# Patient Record
Sex: Female | Born: 2005 | Race: White | Hispanic: No | Marital: Single | Smoking: Never smoker
Health system: Southern US, Community
[De-identification: ages and names within clinical notes are randomized; demographics above are authoritative.]

## PROBLEM LIST (undated history)

## (undated) DIAGNOSIS — K0889 Other specified disorders of teeth and supporting structures: Secondary | ICD-10-CM

## (undated) DIAGNOSIS — M419 Scoliosis, unspecified: Secondary | ICD-10-CM

## (undated) DIAGNOSIS — F419 Anxiety disorder, unspecified: Secondary | ICD-10-CM

## (undated) DIAGNOSIS — F32A Depression, unspecified: Secondary | ICD-10-CM

## (undated) DIAGNOSIS — K429 Umbilical hernia without obstruction or gangrene: Secondary | ICD-10-CM

## (undated) HISTORY — DX: Anxiety disorder, unspecified: F41.9

## (undated) HISTORY — DX: Scoliosis, unspecified: M41.9

## (undated) HISTORY — DX: Depression, unspecified: F32.A

---
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
## 2024-01-13 ENCOUNTER — Telehealth: Payer: Self-pay

## 2024-01-13 NOTE — Telephone Encounter (Signed)
 TRIAGE VOICEMAIL: Patient states she takes a birth control for hormonal reasons along with her nexplanon. She needs a refill on that. She's not sure if she needs to request from pharmacy or if she needs a refill sent bc she doesn't take it all the time.

## 2024-01-13 NOTE — Telephone Encounter (Signed)
 Spoke with patient. Advised a full years rx was sent  03/09/23. She should contact pharmacy to request refill. They will reach out to [place] if there are any issues and a refill is needed form [place].

## 2024-02-14 NOTE — Progress Notes (Unsigned)
   No chief complaint on file.    HPI:  Tracey Griffith is a 18 y.o. G0P0000 here for Nexplanon removal and insertion. Nepxlanon placed 4/22  There were no vitals taken for this visit.   Nexplanon removal Procedure note - The Nexplanon was noted in the patient's arm and the end was identified. The skin was cleansed with a Betadine solution. A small injection of subcutaneous lidocaine with epinephrine was given over the end of the implant. An incision was made at the end of the implant. The rod was noted in the incision and grasped with a hemostat. It was noted to be intact.  Steri-Strip was placed approximating the incision. Hemostasis was noted.  Nexplanon Insertion  Patient given informed consent, signed copy in the chart, time out was performed. Pregnancy test was ***. Appropriate time out taken.  Patient's LEFT/RIGHT *** arm was prepped and draped in the usual sterile fashion. The ruler used to measure and mark insertion area.  Pt was prepped with betadine swab and then injected with *** cc of 2% lidocaine with epinephrine. Nexplanon removed form packaging,  Device confirmed in needle, then inserted full length of needle and withdrawn per handbook instructions.  Pt insertion site covered with steri-strip and a bandage.   Minimal blood loss.  Pt tolerated the procedure well.  Assessment: No diagnosis found.   No orders of the defined types were placed in this encounter.    Plan:   She was told to remove the dressing in 12-24 hours, to keep the incision area dry for 24 hours and to remove the Steristrip in 2-3  days.  Notify us  if any signs of tenderness, redness, pain, or fevers develop.   Jayda White B. Kayshawn Ozburn, PA-C 02/14/2024 4:47 PM

## 2024-02-15 ENCOUNTER — Other Ambulatory Visit (HOSPITAL_COMMUNITY)
Admission: RE | Admit: 2024-02-15 | Discharge: 2024-02-15 | Disposition: A | Discharge status: Home / Self Care | Source: Ambulatory Visit | Attending: Obstetrics and Gynecology | Admitting: Obstetrics and Gynecology

## 2024-02-15 ENCOUNTER — Encounter: Payer: Self-pay | Admitting: Obstetrics and Gynecology

## 2024-02-15 ENCOUNTER — Ambulatory Visit (INDEPENDENT_AMBULATORY_CARE_PROVIDER_SITE_OTHER): Admitting: Obstetrics and Gynecology

## 2024-02-15 VITALS — BP 124/62 | Ht 67.0 in | Wt 131.0 lb

## 2024-02-15 DIAGNOSIS — Z113 Encounter for screening for infections with a predominantly sexual mode of transmission: Secondary | ICD-10-CM | POA: Insufficient documentation

## 2024-02-15 DIAGNOSIS — Z3046 Encounter for surveillance of implantable subdermal contraceptive: Secondary | ICD-10-CM

## 2024-02-15 DIAGNOSIS — E559 Vitamin D deficiency, unspecified: Secondary | ICD-10-CM

## 2024-02-15 MED ORDER — ETONOGESTREL 68 MG ~~LOC~~ IMPL
68.0000 mg | DRUG_IMPLANT | Freq: Once | SUBCUTANEOUS | Status: AC
  Administered 2024-02-15: 68 mg via SUBCUTANEOUS

## 2024-02-15 NOTE — Patient Instructions (Signed)
I value your feedback and you entrusting [place] with your care. If you get a [facility] patient survey, I would appreciate you taking the time to let us know about your experience today. Thank you!  Remove the dressing in 24 hours,  keep the incision area dry for 24 hours and remove the Steristrip in 2-3  days.  Notify us if any signs of tenderness, redness, pain, or fevers develop.

## 2024-02-17 LAB — CERVICOVAGINAL ANCILLARY ONLY
Chlamydia: NEGATIVE
Comment: NEGATIVE
Comment: NORMAL
Neisseria Gonorrhea: NEGATIVE

## 2024-03-27 DIAGNOSIS — Z20822 Contact with and (suspected) exposure to covid-19: Secondary | ICD-10-CM | POA: Diagnosis not present

## 2024-03-27 DIAGNOSIS — R070 Pain in throat: Secondary | ICD-10-CM | POA: Diagnosis not present

## 2024-03-27 DIAGNOSIS — J069 Acute upper respiratory infection, unspecified: Secondary | ICD-10-CM | POA: Diagnosis not present

## 2024-06-21 DIAGNOSIS — U071 COVID-19: Secondary | ICD-10-CM | POA: Diagnosis not present

## 2024-06-21 DIAGNOSIS — R509 Fever, unspecified: Secondary | ICD-10-CM | POA: Diagnosis not present

## 2024-06-21 DIAGNOSIS — Z20822 Contact with and (suspected) exposure to covid-19: Secondary | ICD-10-CM | POA: Diagnosis not present

## 2024-07-21 ENCOUNTER — Encounter: Payer: Self-pay | Admitting: *Deleted

## 2024-09-25 ENCOUNTER — Encounter: Payer: Self-pay | Admitting: Internal Medicine

## 2024-09-25 ENCOUNTER — Ambulatory Visit: Admitting: Internal Medicine

## 2024-09-25 ENCOUNTER — Other Ambulatory Visit: Payer: Self-pay

## 2024-09-25 VITALS — BP 110/70 | HR 98 | Temp 98.3°F | Resp 18 | Ht 66.0 in | Wt 126.8 lb

## 2024-09-25 DIAGNOSIS — F419 Anxiety disorder, unspecified: Secondary | ICD-10-CM | POA: Diagnosis not present

## 2024-09-25 DIAGNOSIS — E559 Vitamin D deficiency, unspecified: Secondary | ICD-10-CM

## 2024-09-25 DIAGNOSIS — F331 Major depressive disorder, recurrent, moderate: Secondary | ICD-10-CM | POA: Diagnosis not present

## 2024-09-25 DIAGNOSIS — R42 Dizziness and giddiness: Secondary | ICD-10-CM

## 2024-09-25 DIAGNOSIS — Z113 Encounter for screening for infections with a predominantly sexual mode of transmission: Secondary | ICD-10-CM

## 2024-09-25 DIAGNOSIS — Z23 Encounter for immunization: Secondary | ICD-10-CM

## 2024-09-25 DIAGNOSIS — Z13220 Encounter for screening for lipoid disorders: Secondary | ICD-10-CM

## 2024-09-25 DIAGNOSIS — R6881 Early satiety: Secondary | ICD-10-CM | POA: Diagnosis not present

## 2024-09-25 DIAGNOSIS — Z0000 Encounter for general adult medical examination without abnormal findings: Secondary | ICD-10-CM | POA: Diagnosis not present

## 2024-09-25 DIAGNOSIS — Z1159 Encounter for screening for other viral diseases: Secondary | ICD-10-CM

## 2024-09-25 MED ORDER — ESCITALOPRAM OXALATE 5 MG PO TABS: 5.0000 mg | ORAL_TABLET | Freq: Every day | ORAL | 1 refills | Status: DC

## 2024-09-25 NOTE — Progress Notes (Signed)
 New Patient Office Visit  Subjective    Patient ID: Tracey Griffith, female    DOB: 2006/01/05  Age: 18 y.o. MRN: [id number]  CC:  Chief Complaint  Patient presents with   Establish Care    HPI Tracey Griffith presents to establish care.  Discussed the use of AI scribe software for clinical note transcription with the patient, who gave verbal consent to proceed.  History of Present Illness Tracey Griffith Tracey Griffith is a 18 year old with anxiety and depression who presents for establishing care.  They were diagnosed with anxiety and depression at age 23. They previously used Prozac  and hydroxyzine . Prozac  caused them to feel spacey, numb, and unlike themself, and hydroxyzine  mainly caused sedation without improving anxiety. They are off psychiatric medications and are open to trying a different treatment.  They report frequent dizziness with vision going spotty or black, which they relate to poor oral intake. They feel hungry but are only able to eat a few bites before feeling full or nauseated, and their appetite has progressively decreased.  Their maternal family has extensive mental health conditions, and many relatives on that side use Zoloft. Their father had significant mental health issues and died before newer antipsychotic options were available.  They use a Nexplanon  implant for contraception. Since placement they have had prolonged bleeding episodes lasting up to two to three months, which they treat with a progesterone pill as needed for breakthrough bleeding. This is their second Nexplanon , and their bleeding pattern has become somewhat more regular over time.   MDD: -Mood status: stable -Current treatment: Nothing Psychotherapy/counseling: no  Previous psychiatric medications: prozac      09/25/2024    9:34 AM 04/02/2020    1:11 PM 08/07/2019    9:42 AM  Depression screen PHQ 2/9  Decreased Interest 1  2  Down, Depressed, Hopeless 1  1  PHQ - 2 Score 2   3  Altered sleeping 1  2  Tired, decreased energy 1  2  Change in appetite 1  1  Feeling bad or failure about yourself  1  1  Trouble concentrating 1  3  Moving slowly or fidgety/restless 1  2  Suicidal thoughts 0    PHQ-9 Score 8  14   Difficult doing work/chores Somewhat difficult       Information is confidential and restricted. Go to Review Flowsheets to unlock data.   Data saved with a previous flowsheet row definition    Contraception: -Has Nexplanon  placed 4/25  -Progesterone as needed for break through bleeding -Following with Gynecology   Health Maintenance: -Blood work due -Flu vaccine due  Outpatient Encounter Medications as of 09/25/2024  Medication Sig   norethindrone  (MICRONOR ) 0.35 MG tablet Take 1 tablet (0.35 mg total) by mouth daily.   etonogestrel  (NEXPLANON ) 68 MG IMPL implant 1 each (68 mg total) by Subdermal route once for 1 dose.   etonogestrel  (NEXPLANON ) 68 MG IMPL implant 1 each by Subdermal route once.   Facility-Administered Encounter Medications as of 09/25/2024  Medication   albendazole  (ALBENZA ) tablet 400 mg    Past Medical History:  Diagnosis Date   Anxiety    Depression    Scoliosis    Tooth loose 09/06/2014   Umbilical hernia 09/2014    Past Surgical History:  Procedure Laterality Date   UMBILICAL HERNIA REPAIR N/A 09/13/2014   Procedure: HERNIA REPAIR UMBILICAL PEDIATRIC;  Surgeon: CHRISTELLA. Julietta Millman, MD;  Location: [facility];  Service: Pediatrics;  Laterality: N/A;    Family History  Problem Relation Age of Onset   Depression Mother    Alcoholism Father    Depression Father    Diabetes Maternal Aunt    Hypertension Maternal Aunt    Hearing loss Maternal Uncle    Diabetes Maternal Grandmother    COPD Maternal Grandmother    Epilepsy Maternal Grandmother        as a child   Breast cancer Maternal Grandmother 36   Diabetes Maternal Grandfather    Hypertension Maternal Grandfather    Alcoholism Paternal  Grandmother    Alcoholism Paternal Grandfather    Cancer Paternal Great-grandfather        started lung cancer then spread to entire body    Social History   Socioeconomic History   Marital status: Single    Spouse name: Not on file   Number of children: Not on file   Years of education: Not on file   Highest education level: Not on file  Occupational History   Not on file  Tobacco Use   Smoking status: Never    Passive exposure: Yes   Smokeless tobacco: Never   Tobacco comments:    mother smokes outside  Vaping Use   Vaping status: Former  Substance and Sexual Activity   Alcohol use: Not Currently   Drug use: Not Currently    Types: Marijuana   Sexual activity: Yes    Birth control/protection: Implant, Pill  Other Topics Concern   Not on file  Social History Narrative   Lives with mom and brother at [employer]   Smokers in home smoke outside   Social Drivers of Health   Financial Resource Strain: Not on file  Food Insecurity: Not on file  Transportation Needs: Not on file  Physical Activity: Not on file  Stress: Not on file  Social Connections: Not on file  Intimate Partner Violence: Not on file    Review of Systems  Psychiatric/Behavioral:  Positive for depression. The patient is nervous/anxious.   All other systems reviewed and are negative.       Objective    BP 110/70 (Cuff Size: Normal)   Pulse 98   Temp 98.3 F (36.8 C) (Oral)   Resp 18   Ht 5' 6 (1.676 m)   Wt 126 lb 12.8 oz (57.5 kg)   LMP 09/25/2024   SpO2 99%   BMI 20.47 kg/m   Physical Exam Constitutional:      Appearance: Normal appearance.  HENT:     Head: Normocephalic and atraumatic.     Mouth/Throat:     Mouth: Mucous membranes are moist.     Pharynx: Oropharynx is clear.  Eyes:     Extraocular Movements: Extraocular movements intact.     Conjunctiva/sclera: Conjunctivae normal.     Pupils: Pupils are equal, round, and reactive to light.  Neck:     Comments: No  thyromegaly Cardiovascular:     Rate and Rhythm: Normal rate and regular rhythm.  Pulmonary:     Effort: Pulmonary effort is normal.     Breath sounds: Normal breath sounds.  Musculoskeletal:     Cervical back: No tenderness.     Right lower leg: No edema.     Left lower leg: No edema.  Lymphadenopathy:     Cervical: No cervical adenopathy.  Skin:    General: Skin is warm and dry.  Neurological:     General: No focal deficit present.     Mental Status:  Tracey Griffith is alert. Mental status is at baseline.  Psychiatric:        Mood and Affect: Mood normal.        Behavior: Behavior normal.         Assessment & Plan:   Assessment & Plan Adult Wellness Visit Routine wellness visit. Vaccinations current. - Ordered blood work: kidney, liver, electrolytes. - Ordered cholesterol panel. - Screened for HIV and hepatitis C. - Administered flu shot.  Anxiety and Depression Diagnosed at age 29. Prozac  caused apathy; hydroxyzine  effective but sedating. Lexapro  preferred for anxiety and appetite issues. Discussed Lexapro  side effects. - Prescribed Lexapro  5 mg daily. - Scheduled follow-up in 6 weeks to assess response.  Early Satiety, Decreased Appetite, and Dizziness Symptoms possibly related to low blood sugar. Lexapro  may improve appetite. - Monitor symptoms and response to Lexapro .  Vitamin D  Deficiency, Screening and Monitoring Vitamin D  level was 30 in 2020. Occasionally takes supplements. - Ordered vitamin D  level. - Consider prescription vitamin D  if levels remain low.  - escitalopram  (LEXAPRO ) 5 MG tablet; Take 1 tablet (5 mg total) by mouth daily.  Dispense: 30 tablet; Refill: 1 - CBC w/Diff/Platelet - Comprehensive Metabolic Panel (CMET) - Lipid Profile - Hepatitis C Antibody - HIV antibody (with reflex) - Vitamin D  (25 hydroxy) - Flu vaccine trivalent PF, 6mos and older(Flulaval,Afluria,Fluarix,Fluzone)   Return in about 6 weeks (around 11/06/2024).   Sharyle Fischer, DO

## 2024-09-26 LAB — COMPREHENSIVE METABOLIC PANEL WITH GFR
AG Ratio: 1.7 (calc) (ref 1.0–2.5)
ALT: 9 U/L (ref 5–32)
AST: 14 U/L (ref 12–32)
Albumin: 4.3 g/dL (ref 3.6–5.1)
Alkaline phosphatase (APISO): 41 U/L (ref 36–128)
BUN: 12 mg/dL (ref 7–20)
CO2: 26 mmol/L (ref 20–32)
Calcium: 9.3 mg/dL (ref 8.9–10.4)
Chloride: 107 mmol/L (ref 98–110)
Creat: 0.7 mg/dL (ref 0.50–0.96)
Globulin: 2.6 g/dL (ref 2.0–3.8)
Glucose, Bld: 84 mg/dL (ref 65–99)
Potassium: 3.9 mmol/L (ref 3.8–5.1)
Sodium: 140 mmol/L (ref 135–146)
Total Bilirubin: 0.3 mg/dL (ref 0.2–1.1)
Total Protein: 6.9 g/dL (ref 6.3–8.2)
eGFR: 128 mL/min/1.73m2 (ref >=60)

## 2024-09-26 LAB — CBC WITH DIFFERENTIAL/PLATELET
Absolute Lymphocytes: 1703 {cells}/uL (ref 850–3900)
Absolute Monocytes: 408 {cells}/uL (ref 200–950)
Basophils Absolute: 41 {cells}/uL (ref 0–200)
Basophils Relative: 0.8 %
Eosinophils Absolute: 128 {cells}/uL (ref 15–500)
Eosinophils Relative: 2.5 %
HCT: 38.9 % (ref 35.9–46.0)
Hemoglobin: 13 g/dL (ref 11.7–15.5)
MCH: 31.5 pg (ref 27.0–33.0)
MCHC: 33.4 g/dL (ref 31.6–35.4)
MCV: 94.2 fL (ref 81.4–101.7)
MPV: 12 fL (ref 7.5–12.5)
Monocytes Relative: 8 %
Neutro Abs: 2820 {cells}/uL (ref 1500–7800)
Neutrophils Relative %: 55.3 %
Platelets: 264 Thousand/uL (ref 140–400)
RBC: 4.13 Million/uL (ref 3.80–5.10)
RDW: 12.9 % (ref 11.0–15.0)
Total Lymphocyte: 33.4 %
WBC: 5.1 Thousand/uL (ref 3.8–10.8)

## 2024-09-26 LAB — LIPID PANEL
Cholesterol: 114 mg/dL (ref <170)
HDL: 54 mg/dL (ref >45)
LDL Cholesterol (Calc): 47 mg/dL (ref <110)
Non-HDL Cholesterol (Calc): 60 mg/dL (ref <120)
Total CHOL/HDL Ratio: 2.1 (calc) (ref <5.0)
Triglycerides: 54 mg/dL (ref <90)

## 2024-09-26 LAB — VITAMIN D 25 HYDROXY (VIT D DEFICIENCY, FRACTURES): Vit D, 25-Hydroxy: 39 ng/mL (ref 30–100)

## 2024-09-26 LAB — HIV ANTIBODY (ROUTINE TESTING W REFLEX)
HIV 1&2 Ab, 4th Generation: NONREACTIVE
HIV FINAL INTERPRETATION: NEGATIVE

## 2024-09-26 LAB — HEPATITIS C ANTIBODY: Hepatitis C Ab: NONREACTIVE

## 2024-09-26 NOTE — Telephone Encounter (Unsigned)
 Copied from CRM #[id number]. Topic: Clinical - Prescription Issue >> Sep 25, 2024  3:38 PM Tobias CROME wrote: Reason for CRM: Patient states she spoke to the pharmacy and informed the lexapro  would be $70, patient went on the good rx website and saw it would be around $400.   Patient inquiring if there is a cheaper alternative, [phone]

## 2024-09-27 ENCOUNTER — Ambulatory Visit: Payer: Self-pay | Admitting: Internal Medicine

## 2024-10-12 ENCOUNTER — Other Ambulatory Visit: Payer: Self-pay

## 2024-10-12 DIAGNOSIS — Z975 Presence of (intrauterine) contraceptive device: Secondary | ICD-10-CM

## 2024-10-12 MED ORDER — NORETHINDRONE 0.35 MG PO TABS: 1.0000 | ORAL_TABLET | Freq: Every day | ORAL | 3 refills | Status: AC

## 2024-11-06 ENCOUNTER — Encounter: Payer: Self-pay | Admitting: Internal Medicine

## 2024-11-06 ENCOUNTER — Other Ambulatory Visit: Payer: Self-pay

## 2024-11-06 ENCOUNTER — Ambulatory Visit: Admitting: Internal Medicine

## 2024-11-06 VITALS — BP 110/72 | HR 102 | Temp 98.3°F | Resp 16 | Ht 66.0 in | Wt 129.9 lb

## 2024-11-06 DIAGNOSIS — F419 Anxiety disorder, unspecified: Secondary | ICD-10-CM

## 2024-11-06 DIAGNOSIS — F331 Major depressive disorder, recurrent, moderate: Secondary | ICD-10-CM | POA: Diagnosis not present

## 2024-11-06 MED ORDER — ESCITALOPRAM OXALATE 10 MG PO TABS: 10.0000 mg | ORAL_TABLET | Freq: Every day | ORAL | 1 refills | Status: DC

## 2024-11-06 MED ORDER — ESCITALOPRAM OXALATE 10 MG PO TABS: 10.0000 mg | ORAL_TABLET | Freq: Every day | ORAL | 1 refills | Status: AC

## 2024-11-06 NOTE — Progress Notes (Signed)
 "  Established Patient Office Visit  Subjective    Patient ID: Tracey Griffith, female    DOB: 14-Jan-2006  Age: 19 y.o. MRN: [id number]  CC:  Chief Complaint  Patient presents with   Anxiety    6 weeks recheck    HPI Tracey Griffith presents to follow up.  Discussed the use of AI scribe software for clinical note transcription with the patient, who gave verbal consent to proceed.  History of Present Illness  Tracey Griffith is a 19 year old who presents for a follow-up on Lexapro  medication.  They started Lexapro  5 mg daily and found the first two weeks challenging, but then felt better. They have no significant side effects, though they occasionally forget a dose until later in the day. Depression symptoms have improved, but anxiety remains mildly bothersome, with better ability to control spiraling thoughts. They had significant difficulty with the medication cost, initially quoted at $400, but ultimately obtained it for $13 with a [facility] coupon, which was stressful and worsened their anxiety.   MDD: -Mood status: stable -Currently on Lexapro  5 mg started at [facility], doing well so far, has noticed some improvement and denies side effects  Psychotherapy/counseling: no  Previous psychiatric medications: prozac      11/06/2024   10:56 AM 09/25/2024    9:34 AM 04/02/2020    1:11 PM 08/07/2019    9:42 AM  Depression screen PHQ 2/9  Decreased Interest 1 1  2   Down, Depressed, Hopeless 0 1  1  PHQ - 2 Score 1 2  3   Altered sleeping 2 1  2   Tired, decreased energy 1 1  2   Change in appetite 1 1  1   Feeling bad or failure about yourself  2 1  1   Trouble concentrating 1 1  3   Moving slowly or fidgety/restless 2 1  2   Suicidal thoughts 0 0    PHQ-9 Score 10 8  14    Difficult doing work/chores Somewhat difficult Somewhat difficult       Information is confidential and restricted. Go to Review Flowsheets to unlock data.   Data saved with a previous flowsheet row  definition    Contraception: -Has Nexplanon  placed 4/25  -Progesterone as needed for break through bleeding -Following with Gynecology   Health Maintenance: -Blood work UTD   Outpatient Encounter Medications as of 11/06/2024  Medication Sig   escitalopram  (LEXAPRO ) 5 MG tablet Take 1 tablet (5 mg total) by mouth daily.   etonogestrel  (NEXPLANON ) 68 MG IMPL implant 1 each by Subdermal route once.   norethindrone  (MICRONOR ) 0.35 MG tablet Take 1 tablet (0.35 mg total) by mouth daily.   Facility-Administered Encounter Medications as of 11/06/2024  Medication   albendazole  (ALBENZA ) tablet 400 mg    Past Medical History:  Diagnosis Date   Anxiety    Depression    Scoliosis    Tooth loose 09/06/2014   Umbilical hernia 09/2014    Past Surgical History:  Procedure Laterality Date   UMBILICAL HERNIA REPAIR N/A 09/13/2014   Procedure: HERNIA REPAIR UMBILICAL PEDIATRIC;  Surgeon: CHRISTELLA. Julietta Millman, MD;  Location: [facility];  Service: Pediatrics;  Laterality: N/A;    Family History  Problem Relation Age of Onset   Depression Mother    Alcoholism Father    Depression Father    Diabetes Maternal Aunt    Hypertension Maternal Aunt    Hearing loss Maternal Uncle    Diabetes Maternal Grandmother  COPD Maternal Grandmother    Epilepsy Maternal Grandmother        as a child   Breast cancer Maternal Grandmother 71   Diabetes Maternal Grandfather    Hypertension Maternal Grandfather    Alcoholism Paternal Grandmother    Alcoholism Paternal Grandfather    Cancer Paternal Great-grandfather        started lung cancer then spread to entire body    Social History   Socioeconomic History   Marital status: Single    Spouse name: Not on file   Number of children: Not on file   Years of education: Not on file   Highest education level: Not on file  Occupational History   Not on file  Tobacco Use   Smoking status: Never    Passive exposure: Yes   Smokeless  tobacco: Never   Tobacco comments:    mother smokes outside  Vaping Use   Vaping status: Former  Substance and Sexual Activity   Alcohol use: Not Currently   Drug use: Not Currently    Types: Marijuana   Sexual activity: Yes    Birth control/protection: Implant, Pill  Other Topics Concern   Not on file  Social History Narrative   Lives with mom and brother at [facility] [employer] [facility] [facility]   Smokers in home smoke outside   Social Drivers of Health   Tobacco Use: Medium Risk (11/06/2024)   Patient History    Smoking Tobacco Use: Never    Smokeless Tobacco Use: Never    Passive Exposure: Yes  Financial Resource Strain: Not on file  Food Insecurity: Not on file  Transportation Needs: Not on file  Physical Activity: Not on file  Stress: Not on file  Social Connections: Not on file  Intimate Partner Violence: Not on file  Depression (EYV7-0): Medium Risk (11/06/2024)   Depression (PHQ2-9)    PHQ-2 Score: 10  Alcohol Screen: Low Risk (09/25/2024)   Alcohol Screen    Last Alcohol Screening Score (AUDIT): 0  Housing: Not on file  Utilities: Not on file  Health Literacy: Not on file    Review of Systems  Psychiatric/Behavioral:  Negative for depression. The patient is nervous/anxious.   All other systems reviewed and are negative.       Objective    BP 110/72 (Cuff Size: Normal)   Pulse (!) 102   Temp 98.3 F (36.8 C) (Oral)   Resp 16   Ht 5' 6 (1.676 m)   Wt 129 lb 14.4 oz (58.9 kg)   SpO2 99%   BMI 20.97 kg/m   Physical Exam Constitutional:      Appearance: Normal appearance.  HENT:     Head: Normocephalic and atraumatic.  Eyes:     Conjunctiva/sclera: Conjunctivae normal.  Cardiovascular:     Rate and Rhythm: Normal rate and regular rhythm.  Pulmonary:     Effort: Pulmonary effort is normal.     Breath sounds: Normal breath sounds.  Skin:    General: Skin is warm and dry.  Neurological:     General: No focal deficit present.     Mental Status: Griffith is alert. Mental status is  at baseline.  Psychiatric:        Mood and Affect: Mood normal.        Behavior: Behavior normal.         Assessment & Plan:   Assessment & Plan  Major depressive disorder, recurrent, moderate Improvement in depression symptoms on escitalopram  5 mg, but anxiety persists. Discussed dose  flexibility based on stressors and symptom control. - Increased escitalopram  to 10 mg daily. - Provided paper coupons for medication cost reduction. - Prescribed a 90-day supply with refills.  Anxiety disorder Anxiety symptoms more manageable but persistent. Discussed dose adjustment for better control. - Increased escitalopram  to 10 mg daily to help manage anxiety symptoms.  - escitalopram  (LEXAPRO ) 10 MG tablet; Take 1 tablet (10 mg total) by mouth daily.  Dispense: 90 tablet; Refill: 1   Return in about 3 months (around 02/04/2025) for virtual .   Sharyle Fischer, DO  "

## 2024-12-02 ENCOUNTER — Encounter: Payer: Self-pay | Admitting: Internal Medicine

## 2024-12-02 ENCOUNTER — Telehealth: Admitting: Family Medicine

## 2024-12-02 ENCOUNTER — Encounter: Payer: Self-pay | Admitting: Obstetrics and Gynecology

## 2024-12-02 DIAGNOSIS — N3000 Acute cystitis without hematuria: Secondary | ICD-10-CM

## 2024-12-02 DIAGNOSIS — B3731 Acute candidiasis of vulva and vagina: Secondary | ICD-10-CM

## 2024-12-02 MED ORDER — FLUCONAZOLE 150 MG PO TABS: 150.0000 mg | ORAL_TABLET | Freq: Every day | ORAL | 0 refills | Status: AC

## 2024-12-02 MED ORDER — CEPHALEXIN 500 MG PO CAPS: 500.0000 mg | ORAL_CAPSULE | Freq: Two times a day (BID) | ORAL | 0 refills | Status: AC

## 2024-12-02 NOTE — Progress Notes (Signed)

## 2025-02-12 ENCOUNTER — Ambulatory Visit: Admitting: Internal Medicine
